# Patient Record
Sex: Male | Born: 2000 | Race: Black or African American | Hispanic: No | Marital: Single | Smoking: Never smoker
Health system: Southern US, Community
[De-identification: ages and names within clinical notes are randomized; demographics above are authoritative.]

---
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
## 2022-01-15 ENCOUNTER — Ambulatory Visit (HOSPITAL_COMMUNITY): Payer: Self-pay

## 2022-01-15 DIAGNOSIS — M25462 Effusion, left knee: Secondary | ICD-10-CM | POA: Diagnosis not present

## 2022-01-15 DIAGNOSIS — M25562 Pain in left knee: Secondary | ICD-10-CM | POA: Diagnosis not present

## 2022-01-19 DIAGNOSIS — M25562 Pain in left knee: Secondary | ICD-10-CM | POA: Diagnosis not present

## 2022-01-29 DIAGNOSIS — M25562 Pain in left knee: Secondary | ICD-10-CM | POA: Diagnosis not present
